# Patient Record
Sex: Male | Born: 1995 | Race: White | Hispanic: No | Marital: Single | Smoking: Current every day smoker
Health system: Southern US, Community
[De-identification: ages and names within clinical notes are randomized; demographics above are authoritative.]

## PROBLEM LIST (undated history)

## (undated) DIAGNOSIS — Z789 Other specified health status: Secondary | ICD-10-CM

## (undated) DIAGNOSIS — F1010 Alcohol abuse, uncomplicated: Secondary | ICD-10-CM

## (undated) HISTORY — PX: NO PAST SURGERIES: SHX2092

---
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
## 2017-01-28 ENCOUNTER — Encounter: Payer: Self-pay | Admitting: Emergency Medicine

## 2017-01-28 ENCOUNTER — Other Ambulatory Visit: Payer: Self-pay

## 2017-01-28 DIAGNOSIS — Y999 Unspecified external cause status: Secondary | ICD-10-CM | POA: Insufficient documentation

## 2017-01-28 DIAGNOSIS — S61511A Laceration without foreign body of right wrist, initial encounter: Secondary | ICD-10-CM | POA: Insufficient documentation

## 2017-01-28 DIAGNOSIS — W25XXXA Contact with sharp glass, initial encounter: Secondary | ICD-10-CM | POA: Insufficient documentation

## 2017-01-28 DIAGNOSIS — F17210 Nicotine dependence, cigarettes, uncomplicated: Secondary | ICD-10-CM | POA: Insufficient documentation

## 2017-01-28 DIAGNOSIS — Y9389 Activity, other specified: Secondary | ICD-10-CM | POA: Insufficient documentation

## 2017-01-28 DIAGNOSIS — Z23 Encounter for immunization: Secondary | ICD-10-CM | POA: Insufficient documentation

## 2017-01-28 DIAGNOSIS — Y929 Unspecified place or not applicable: Secondary | ICD-10-CM | POA: Insufficient documentation

## 2017-01-28 NOTE — ED Triage Notes (Signed)
Pt arrives POV and ambulatory to triage with c/o right wrist laceration. Pt reports that he was trying to catch some window glass and it broke. Pt has approximately 3-4 inch laceration to underside of right wrist with controlled bleeding at this time. Pt is in NAD.

## 2017-01-29 ENCOUNTER — Emergency Department: Payer: Self-pay

## 2017-01-29 ENCOUNTER — Emergency Department
Admission: EM | Admit: 2017-01-29 | Discharge: 2017-01-29 | Disposition: A | Discharge status: Home / Self Care | Payer: Self-pay | Attending: Emergency Medicine | Admitting: Emergency Medicine

## 2017-01-29 ENCOUNTER — Other Ambulatory Visit: Payer: Self-pay

## 2017-01-29 DIAGNOSIS — S61511A Laceration without foreign body of right wrist, initial encounter: Secondary | ICD-10-CM

## 2017-01-29 MED ORDER — TETANUS-DIPHTH-ACELL PERTUSSIS 5-2.5-18.5 LF-MCG/0.5 IM SUSP
INTRAMUSCULAR | Status: AC
  Administered 2017-01-29: 0.5 mL via INTRAMUSCULAR
  Filled 2017-01-29: qty 0.5

## 2017-01-29 MED ORDER — LIDOCAINE HCL (PF) 1 % IJ SOLN
5.0000 mL | Freq: Once | INTRAMUSCULAR | Status: AC
  Administered 2017-01-29: 5 mL
  Filled 2017-01-29: qty 5

## 2017-01-29 MED ORDER — TETANUS-DIPHTHERIA TOXOIDS TD 5-2 LFU IM INJ
0.5000 mL | INJECTION | Freq: Once | INTRAMUSCULAR | Status: DC
  Filled 2017-01-29: qty 0.5

## 2017-01-29 MED ORDER — TETANUS-DIPHTH-ACELL PERTUSSIS 5-2.5-18.5 LF-MCG/0.5 IM SUSP
0.5000 mL | Freq: Once | INTRAMUSCULAR | Status: AC
  Administered 2017-01-29: 0.5 mL via INTRAMUSCULAR

## 2017-01-29 NOTE — ED Provider Notes (Signed)
[facility] Emergency Department Provider Note  ____________________________________________  Time seen: Approximately 4:31 AM  I have reviewed the triage vital signs and the nursing notes.   HISTORY  Chief Complaint Laceration   HPI Jeffrey Shepherd is a 21 y.o. male with no significant past medical history who presents for evaluation of a laceration. Patient reports that he was working on his house changing some old glass from the window when the glass shattered and a piece fell onto his wrist. He sustained a laceration in the volar aspect of his right wrist.he is complaining of mild constant pain since that happened at 9 PM this evening. Patient does not remember when his last tetanus shot was but he thinks was more than 10 years ago. Patient denies any other injuries.   History reviewed. No pertinent past medical history.  There are no active problems to display for this patient.   History reviewed. No pertinent surgical history.  Prior to Admission medications   Not on File    Allergies Patient has no known allergies.  No family history on file.  Social History Social History   Tobacco Use  . Smoking status: Current Every Day Smoker  . Smokeless tobacco: Never Used  Substance Use Topics  . Alcohol use: Yes    Frequency: Never    Comment: occasionally  . Drug use: No    Review of Systems  Constitutional: Negative for fever. Eyes: Negative for visual changes. ENT: Negative for sore throat. Neck: No neck pain  Cardiovascular: Negative for chest pain. Respiratory: Negative for shortness of breath. Gastrointestinal: Negative for abdominal pain, vomiting or diarrhea. Genitourinary: Negative for dysuria. Musculoskeletal: Negative for back pain. Skin: Negative for rash. + R wrist laceration Neurological: Negative for headaches, weakness or numbness. Psych: No SI or HI  ____________________________________________   PHYSICAL  EXAM:  VITAL SIGNS: ED Triage Vitals  Enc Vitals Group     BP 01/28/17 2305 (!) 168/78     Pulse Rate 01/28/17 2305 (!) 103     Resp 01/28/17 2305 18     Temp 01/28/17 2305 98.7 F (37.1 C)     Temp Source 01/28/17 2305 Oral     SpO2 01/28/17 2305 98 %     Weight 01/28/17 2306 183 lb (83 kg)     Height 01/28/17 2306 5\' 6"  (1.676 m)     Head Circumference --      Peak Flow --      Pain Score 01/28/17 2306 0     Pain Loc --      Pain Edu? --      Excl. in GC? --     Constitutional: Alert and oriented. Well appearing and in no apparent distress. HEENT:      Head: Normocephalic and atraumatic.         Eyes: Conjunctivae are normal. Sclera is non-icteric.       Mouth/Throat: Mucous membranes are moist.       Neck: Supple with no signs of meningismus. Cardiovascular: Regular rate and rhythm. No murmurs, gallops, or rubs. 2+ symmetrical distal pulses are present in all extremities. No JVD. Respiratory: Normal respiratory effort. Lungs are clear to auscultation bilaterally. No wheezes, crackles, or rhonchi.  Musculoskeletal: 5 cm laceration over the volar aspect of the R wrist with no tendon or arterial involvement, neurovascular exam normal. Neurologic: Normal speech and language. Face is symmetric. Moving all extremities. No gross focal neurologic deficits are appreciated. Skin: Skin is warm, dry  and intact. No rash noted. Psychiatric: Mood and affect are normal. Speech and behavior are normal.  ____________________________________________   LABS (all labs ordered are listed, but only abnormal results are displayed)  Labs Reviewed - No data to display ____________________________________________  EKG  none  ____________________________________________  RADIOLOGY  XR R wrist:  Soft tissue laceration of the volar aspect of the wrist. No radiopaque soft tissue foreign bodies. No acute bony  abnormalities. ____________________________________________   PROCEDURES  Procedure(s) performed:yes .Marland Kitchen.Laceration Repair Date/Time: 01/29/2017 6:28 AM Performed by: Nita SickleVeronese, [place], MD Authorized by: Nita SickleVeronese, [place], MD   Consent:    Consent obtained:  Verbal   Consent given by:  Patient   Risks discussed:  Infection, need for additional repair, pain, poor cosmetic result, retained foreign body, tendon damage and nerve damage Anesthesia (see MAR for exact dosages):    Anesthesia method:  Local infiltration   Local anesthetic:  Lidocaine 1% w/o epi Laceration details:    Location:  Shoulder/arm   Shoulder/arm location:  R lower arm   Length (cm):  6 Repair type:    Repair type:  Simple Pre-procedure details:    Preparation:  Patient was prepped and draped in usual sterile fashion and imaging obtained to evaluate for foreign bodies Exploration:    Hemostasis achieved with:  Direct pressure   Wound exploration: entire depth of wound probed and visualized     Wound extent: no fascia violation noted, no foreign bodies/material noted, no muscle damage noted, no nerve damage noted, no tendon damage noted, no underlying fracture noted and no vascular damage noted     Contaminated: no   Treatment:    Area cleansed with:  Betadine   Amount of cleaning:  Standard   Irrigation solution:  Tap water   Irrigation method:  Tap   Visualized foreign bodies/material removed: no   Skin repair:    Repair method:  Sutures   Suture size:  5-0   Suture material:  Prolene   Suture technique:  Simple interrupted Approximation:    Approximation:  Close   Vermilion border: well-aligned   Post-procedure details:    Dressing:  Open (no dressing)   Patient tolerance of procedure:  Tolerated well, no immediate complications   Critical Care performed:  None ____________________________________________   INITIAL IMPRESSION / ASSESSMENT AND PLAN / ED COURSE   21 y.o. male with no  significant past medical history who presents for evaluation of a 6 cm laceration to the R volar wrist.no foreign bodies seen on visual inspection and x-ray. Limb neurovascularly intact with no tendon involvement. Laceration was repair per procedure note above. Tetanus shot was given. Wound care and signs of infection were discussed with patient and recommended that he return if these develop.      As part of my medical decision making, I reviewed the following data within the electronic MEDICAL RECORD NUMBER Nursing notes reviewed and incorporated, Radiograph reviewed , Notes from prior ED visits and [place] Controlled Substance Database    Pertinent labs & imaging results that were available during my care of the patient were reviewed by me and considered in my medical decision making (see chart for details).    ____________________________________________   FINAL CLINICAL IMPRESSION(S) / ED DIAGNOSES  Final diagnoses:  Laceration of right wrist, initial encounter      NEW MEDICATIONS STARTED DURING THIS VISIT:  ED Discharge Orders    None       Note:  This document was prepared using Dragon voice recognition  software and may include unintentional dictation errors.    Don Perking, [place], MD 01/29/17 0630

## 2017-01-29 NOTE — ED Notes (Signed)
Pt states that he was at home working and changing glass when it broke and cut his right wrist. Family at bedside.

## 2017-01-29 NOTE — Discharge Instructions (Signed)
Keep laceration dry and clean. Wash with warm water and soap. Apply topical bacitracin. Protect from the sun to minimize scarring. Cover it with SPF 70 or higher and use hat when out in the sun for 6-9 months. You received 5 stitches that must be removed in 5-7 days.  Watch for signs of infection: pus, redness of the skin surrounding it, or fever. If these develop see your doctor or return to the ER for antibiotics.

## 2017-05-31 ENCOUNTER — Emergency Department
Admission: EM | Admit: 2017-05-31 | Discharge: 2017-05-31 | Disposition: A | Discharge status: Home / Self Care | Payer: Self-pay | Attending: Emergency Medicine | Admitting: Emergency Medicine

## 2017-05-31 ENCOUNTER — Other Ambulatory Visit: Payer: Self-pay

## 2017-05-31 ENCOUNTER — Encounter: Payer: Self-pay | Admitting: Emergency Medicine

## 2017-05-31 DIAGNOSIS — W268XXA Contact with other sharp object(s), not elsewhere classified, initial encounter: Secondary | ICD-10-CM | POA: Insufficient documentation

## 2017-05-31 DIAGNOSIS — Z5321 Procedure and treatment not carried out due to patient leaving prior to being seen by health care provider: Secondary | ICD-10-CM | POA: Insufficient documentation

## 2017-05-31 DIAGNOSIS — Y929 Unspecified place or not applicable: Secondary | ICD-10-CM | POA: Insufficient documentation

## 2017-05-31 DIAGNOSIS — Y9389 Activity, other specified: Secondary | ICD-10-CM | POA: Insufficient documentation

## 2017-05-31 DIAGNOSIS — Y999 Unspecified external cause status: Secondary | ICD-10-CM | POA: Insufficient documentation

## 2017-05-31 DIAGNOSIS — S61411A Laceration without foreign body of right hand, initial encounter: Secondary | ICD-10-CM | POA: Insufficient documentation

## 2017-05-31 NOTE — ED Triage Notes (Addendum)
Patient ambulatory to triage with steady gait, without difficulty or distress noted; pt reports cutting fire wood, it splintered cutting hand; approx 1" lac noted between right thumb & 1st finger with no active bleeding; gauze dressing applied

## 2017-07-12 ENCOUNTER — Observation Stay
Admission: EM | Admit: 2017-07-12 | Discharge: 2017-07-13 | Disposition: A | Discharge status: Home / Self Care | Payer: Self-pay | Attending: Internal Medicine | Admitting: Internal Medicine

## 2017-07-12 ENCOUNTER — Other Ambulatory Visit: Payer: Self-pay

## 2017-07-12 ENCOUNTER — Observation Stay: Payer: Self-pay

## 2017-07-12 DIAGNOSIS — K922 Gastrointestinal hemorrhage, unspecified: Principal | ICD-10-CM

## 2017-07-12 DIAGNOSIS — Z79899 Other long term (current) drug therapy: Secondary | ICD-10-CM | POA: Insufficient documentation

## 2017-07-12 DIAGNOSIS — K760 Fatty (change of) liver, not elsewhere classified: Secondary | ICD-10-CM | POA: Insufficient documentation

## 2017-07-12 DIAGNOSIS — Z8249 Family history of ischemic heart disease and other diseases of the circulatory system: Secondary | ICD-10-CM | POA: Insufficient documentation

## 2017-07-12 DIAGNOSIS — R7989 Other specified abnormal findings of blood chemistry: Secondary | ICD-10-CM | POA: Insufficient documentation

## 2017-07-12 DIAGNOSIS — F17200 Nicotine dependence, unspecified, uncomplicated: Secondary | ICD-10-CM | POA: Insufficient documentation

## 2017-07-12 DIAGNOSIS — R197 Diarrhea, unspecified: Secondary | ICD-10-CM | POA: Insufficient documentation

## 2017-07-12 DIAGNOSIS — R1030 Lower abdominal pain, unspecified: Secondary | ICD-10-CM | POA: Insufficient documentation

## 2017-07-12 DIAGNOSIS — F1010 Alcohol abuse, uncomplicated: Secondary | ICD-10-CM | POA: Insufficient documentation

## 2017-07-12 DIAGNOSIS — R509 Fever, unspecified: Secondary | ICD-10-CM | POA: Insufficient documentation

## 2017-07-12 HISTORY — DX: Alcohol abuse, uncomplicated: F10.10

## 2017-07-12 HISTORY — DX: Other specified health status: Z78.9

## 2017-07-12 LAB — URINALYSIS, COMPLETE (UACMP) WITH MICROSCOPIC
BILIRUBIN URINE: NEGATIVE
Bacteria, UA: NONE SEEN
Glucose, UA: NEGATIVE mg/dL
HGB URINE DIPSTICK: NEGATIVE
Ketones, ur: NEGATIVE mg/dL
LEUKOCYTES UA: NEGATIVE
NITRITE: NEGATIVE
PH: 6 (ref 5.0–8.0)
Protein, ur: NEGATIVE mg/dL
SPECIFIC GRAVITY, URINE: 1.023 (ref 1.005–1.030)
Squamous Epithelial / LPF: NONE SEEN (ref 0–5)

## 2017-07-12 LAB — HEPATIC FUNCTION PANEL
ALT: 169 U/L — ABNORMAL HIGH (ref 17–63)
AST: 121 U/L — AB (ref 15–41)
Albumin: 4.4 g/dL (ref 3.5–5.0)
Alkaline Phosphatase: 62 U/L (ref 38–126)
BILIRUBIN DIRECT: 0.1 mg/dL (ref 0.1–0.5)
Indirect Bilirubin: 0.8 mg/dL (ref 0.3–0.9)
TOTAL PROTEIN: 7.8 g/dL (ref 6.5–8.1)
Total Bilirubin: 0.9 mg/dL (ref 0.3–1.2)

## 2017-07-12 LAB — BASIC METABOLIC PANEL
ANION GAP: 10 (ref 5–15)
BUN: 14 mg/dL (ref 6–20)
CO2: 25 mmol/L (ref 22–32)
Calcium: 9.2 mg/dL (ref 8.9–10.3)
Chloride: 100 mmol/L — ABNORMAL LOW (ref 101–111)
Creatinine, Ser: 0.81 mg/dL (ref 0.61–1.24)
GFR calc Af Amer: >60 mL/min (ref >60)
GFR calc non Af Amer: >60 mL/min (ref >60)
Glucose, Bld: 118 mg/dL — ABNORMAL HIGH (ref 65–99)
POTASSIUM: 3.7 mmol/L (ref 3.5–5.1)
SODIUM: 135 mmol/L (ref 135–145)

## 2017-07-12 LAB — CBC WITH DIFFERENTIAL/PLATELET
BASOS PCT: 1 %
Basophils Absolute: 0 10*3/uL (ref 0–0.1)
EOS ABS: 0.1 10*3/uL (ref 0–0.7)
Eosinophils Relative: 1 %
HCT: 48.9 % (ref 40.0–52.0)
HEMOGLOBIN: 16.9 g/dL (ref 13.0–18.0)
LYMPHS ABS: 1.5 10*3/uL (ref 1.0–3.6)
Lymphocytes Relative: 20 %
MCH: 32.9 pg (ref 26.0–34.0)
MCHC: 34.5 g/dL (ref 32.0–36.0)
MCV: 95.2 fL (ref 80.0–100.0)
Monocytes Absolute: 0.5 10*3/uL (ref 0.2–1.0)
Monocytes Relative: 7 %
NEUTROS PCT: 71 %
Neutro Abs: 5.1 10*3/uL (ref 1.4–6.5)
Platelets: 223 10*3/uL (ref 150–440)
RBC: 5.14 MIL/uL (ref 4.40–5.90)
RDW: 13.2 % (ref 11.5–14.5)
WBC: 7.2 10*3/uL (ref 3.8–10.6)

## 2017-07-12 LAB — PROTIME-INR
INR: 1.04
PROTHROMBIN TIME: 13.5 s (ref 11.4–15.2)

## 2017-07-12 LAB — LIPASE, BLOOD: Lipase: 26 U/L (ref 11–51)

## 2017-07-12 LAB — HEMOGLOBIN: Hemoglobin: 15.8 g/dL (ref 13.0–18.0)

## 2017-07-12 MED ORDER — SODIUM CHLORIDE 0.9 % IV SOLN
8.0000 mg/h | INTRAVENOUS | Status: DC
  Administered 2017-07-12 – 2017-07-13 (×2): 8 mg/h via INTRAVENOUS
  Filled 2017-07-12 (×4): qty 80

## 2017-07-12 MED ORDER — SODIUM CHLORIDE 0.9 % IV BOLUS
1000.0000 mL | Freq: Once | INTRAVENOUS | Status: AC
  Administered 2017-07-12: 1000 mL via INTRAVENOUS

## 2017-07-12 MED ORDER — ADULT MULTIVITAMIN W/MINERALS CH
ORAL_TABLET | ORAL | Status: AC
  Administered 2017-07-12: 20:00:00
  Filled 2017-07-12: qty 1

## 2017-07-12 MED ORDER — ONDANSETRON HCL 4 MG/2ML IJ SOLN
4.0000 mg | Freq: Once | INTRAMUSCULAR | Status: AC
Start: 2017-07-12 — End: 2017-07-12
  Administered 2017-07-12: 4 mg via INTRAVENOUS

## 2017-07-12 MED ORDER — FOLIC ACID 1 MG PO TABS
ORAL_TABLET | ORAL | Status: AC
  Administered 2017-07-12: 1 mg via ORAL
  Filled 2017-07-12: qty 1

## 2017-07-12 MED ORDER — IOPAMIDOL (ISOVUE-370) INJECTION 76%
75.0000 mL | Freq: Once | INTRAVENOUS | Status: AC | PRN
  Administered 2017-07-12: 75 mL via INTRAVENOUS

## 2017-07-12 MED ORDER — VITAMIN B-1 100 MG PO TABS
100.0000 mg | ORAL_TABLET | Freq: Every day | ORAL | Status: DC
  Administered 2017-07-12 – 2017-07-13 (×2): 100 mg via ORAL
  Filled 2017-07-12: qty 1

## 2017-07-12 MED ORDER — FOLIC ACID 1 MG PO TABS
1.0000 mg | ORAL_TABLET | Freq: Every day | ORAL | Status: DC
  Administered 2017-07-12 – 2017-07-13 (×2): 1 mg via ORAL
  Filled 2017-07-12: qty 1

## 2017-07-12 MED ORDER — ADULT MULTIVITAMIN W/MINERALS CH
1.0000 | ORAL_TABLET | Freq: Every day | ORAL | Status: DC
  Administered 2017-07-12 – 2017-07-13 (×2): 1 via ORAL
  Filled 2017-07-12: qty 1

## 2017-07-12 MED ORDER — ADULT MULTIVITAMIN W/MINERALS CH
ORAL_TABLET | ORAL | Status: AC
  Administered 2017-07-12: 1 via ORAL
  Filled 2017-07-12: qty 1

## 2017-07-12 MED ORDER — SODIUM CHLORIDE 0.9 % IV SOLN
80.0000 mg | Freq: Once | INTRAVENOUS | Status: AC
  Administered 2017-07-12: 80 mg via INTRAVENOUS
  Filled 2017-07-12: qty 80

## 2017-07-12 MED ORDER — LORAZEPAM 1 MG PO TABS: 1.0000 mg | ORAL_TABLET | ORAL | Status: DC | PRN

## 2017-07-12 MED ORDER — IOPAMIDOL (ISOVUE-300) INJECTION 61%
30.0000 mL | Freq: Once | INTRAVENOUS | Status: AC
  Administered 2017-07-12: 30 mL via ORAL

## 2017-07-12 MED ORDER — THIAMINE HCL 100 MG/ML IJ SOLN: 100.0000 mg | Freq: Every day | INTRAMUSCULAR | Status: DC

## 2017-07-12 MED ORDER — ONDANSETRON HCL 4 MG/2ML IJ SOLN
INTRAMUSCULAR | Status: AC
  Administered 2017-07-12: 21:00:00
  Filled 2017-07-12: qty 2

## 2017-07-12 MED ORDER — DOCUSATE SODIUM 100 MG PO CAPS: 100.0000 mg | ORAL_CAPSULE | Freq: Two times a day (BID) | ORAL | Status: DC | PRN

## 2017-07-12 MED ORDER — LORAZEPAM 2 MG/ML IJ SOLN: 1.0000 mg | INTRAMUSCULAR | Status: DC | PRN

## 2017-07-12 MED ORDER — SODIUM CHLORIDE 0.9 % IV SOLN
INTRAVENOUS | Status: DC
  Administered 2017-07-12 – 2017-07-13 (×2): via INTRAVENOUS

## 2017-07-12 MED ORDER — ONDANSETRON HCL 4 MG/2ML IJ SOLN
4.0000 mg | Freq: Once | INTRAMUSCULAR | Status: AC
  Administered 2017-07-12: 4 mg via INTRAVENOUS
  Filled 2017-07-12: qty 2

## 2017-07-12 NOTE — ED Notes (Signed)
Report received from [facility]. Patient care assumed. Patient/RN introduction complete. Admitting md in to eval, pt has no complaints of pain or nausea at this time. Meds infusing well, peripheral lines intact without redness or swelling. Family at bedside, will continue to monitor.

## 2017-07-12 NOTE — ED Notes (Signed)
Pt to CT then to 226

## 2017-07-12 NOTE — ED Notes (Signed)
Pt drinking contrast for CT. 

## 2017-07-12 NOTE — ED Notes (Signed)
ED Provider at bedside. 

## 2017-07-12 NOTE — ED Notes (Signed)
Report called to floor RN will transport to 226 after CT done.

## 2017-07-12 NOTE — ED Provider Notes (Signed)
[facility] Emergency Department Provider Note ____________________________________________   First MD Initiated Contact with Patient 07/12/17 1546     (approximate)  I have reviewed the triage vital signs and the nursing notes.   HISTORY  Chief Complaint Abdominal Pain; Emesis; and Diarrhea    HPI Jeffrey Shepherd is a 22 y.o. male with past history of alcohol abuse who presents with diarrhea and vomiting over the last 2 days, intermittent, and both developing some bright red blood today.  Patient denies any blood in the stool or vomit previously.  He reports one episode of bright red blood in vomit today.  He also reports increased abdominal distention over the last week, but with no acute abdominal pain.  No fever or chills.  No chest pain or difficulty breathing.  He denies any other abnormal bleeding or bruising.   Past Medical History:  Diagnosis Date  . Alcohol abuse     There are no active problems to display for this patient.   History reviewed. No pertinent surgical history.  Prior to Admission medications   Not on File    Allergies Patient has no known allergies.  No family history on file.  Social History Social History   Tobacco Use  . Smoking status: Current Every Day Smoker  . Smokeless tobacco: Never Used  Substance Use Topics  . Alcohol use: Yes    Frequency: Never    Comment: daily  . Drug use: No    Review of Systems  Constitutional: No fever. Eyes: No jaundice. ENT: No sore throat. Cardiovascular: Denies chest pain. Respiratory: Denies shortness of breath. Gastrointestinal: Positive for nausea, vomiting, diarrhea.  Genitourinary: Negative for dysuria.  Musculoskeletal: Negative for back pain. Skin: Negative for rash. Neurological: Negative for headache.   ____________________________________________   PHYSICAL EXAM:  VITAL SIGNS: ED Triage Vitals [07/12/17 1542]  Enc Vitals Group     BP (!) 160/101       Pulse Rate (!) 109     Resp 18     Temp 98 F (36.7 C)     Temp Source Oral     SpO2 98 %     Weight 195 lb (88.5 kg)     Height  (1.651 m)     Head Circumference      Peak Flow      Pain Score 9     Pain Loc      Pain Edu?      Excl. in GC?     Constitutional: Alert and oriented.  Relatively well appearing and in no acute distress. Eyes: Conjunctivae are normal.  No scleral icterus. Head: Atraumatic. Nose: No congestion/rhinnorhea. Mouth/Throat: Mucous membranes are dry though.   Neck: Normal range of motion.  Cardiovascular: Tachycardic, regular rhythm. Grossly normal heart sounds.  Good peripheral circulation. Respiratory: Normal respiratory effort.  No retractions. Lungs CTAB. Gastrointestinal: Soft and nontender.  Moderate distention distention.  No hemorrhoids or acute bleeding on DRE. Genitourinary: No flank tenderness. Musculoskeletal: No lower extremity edema.  Extremities warm and well perfused.  Neurologic:  Normal speech and language. No gross focal neurologic deficits are appreciated.  No tremors or asterixis. Skin:  Skin is warm and dry. No rash noted. Psychiatric: Mood and affect are normal. Speech and behavior are normal.  ____________________________________________   LABS (all labs ordered are listed, but only abnormal results are displayed)  Labs Reviewed  BASIC METABOLIC PANEL - Abnormal; Notable for the following components:  Result Value   Chloride 100 (*)    Glucose, Bld 118 (*)    All other components within normal limits  HEPATIC FUNCTION PANEL - Abnormal; Notable for the following components:   AST 121 (*)    ALT 169 (*)    All other components within normal limits  URINALYSIS, COMPLETE (UACMP) WITH MICROSCOPIC - Abnormal; Notable for the following components:   Color, Urine YELLOW (*)    APPearance CLEAR (*)    All other components within normal limits  LIPASE, BLOOD  CBC WITH DIFFERENTIAL/PLATELET  PROTIME-INR    ____________________________________________  EKG   ____________________________________________  RADIOLOGY    ____________________________________________   PROCEDURES  Procedure(s) performed: No  Procedures  Critical Care performed: No ____________________________________________   INITIAL IMPRESSION / ASSESSMENT AND PLAN / ED COURSE  Pertinent labs & imaging results that were available during my care of the patient were reviewed by me and considered in my medical decision making (see chart for details).  22 year old male with history of alcohol abuse presents with nausea, vomiting, diarrhea over the last 2 days, but with apparent blood both in the vomit in the stool noted today.  He also reports abdominal distention but no pain.  On exam, patient is slightly tachycardic, other vital signs are normal, and he is relatively well-appearing.  His abdomen is somewhat distended but nontender.  I performed DRE and there were no hemorrhoids and no active bleeding.  Only a trace amount of stool was obtained and this sample was guaiac negative.  Differential includes gastroenteritis or other benign cause, however given his history of alcohol abuse and the report of hematemesis, I am concerned for gastritis, PUD, or other acute GI bleed.  I have low suspicion for esophageal varices given his age and lack of prior history of cirrhosis, however his abdominal distention is concerning.  Plan: Symptomatic treatment, fluids, IV Protonix drip, lab work-up including LFTs and coags, and reassess.  Anticipate likely admission for GI work-up.    ----------------------------------------- 6:51 PM on 07/12/2017 -----------------------------------------  The patient's lab work-up is reassuring.  His vital signs have improved after fluids.  He has had no recurrent vomiting or diarrhea.  Given the possibility of upper GI bleed, the patient will still require admission for serial hemoglobins and  monitoring and possible GI consult.  The patient agrees with this plan.  I signed out the patient to the hospitalist Dr. Elisabeth Pigeon.  ____________________________________________   FINAL CLINICAL IMPRESSION(S) / ED DIAGNOSES  Final diagnoses:  Gastrointestinal hemorrhage, unspecified gastrointestinal hemorrhage type      NEW MEDICATIONS STARTED DURING THIS VISIT:  New Prescriptions   No medications on file     Note:  This document was prepared using Dragon voice recognition software and may include unintentional dictation errors.   Dionne Bucy, MD 07/12/17 [id number]

## 2017-07-12 NOTE — ED Notes (Signed)
Awaiting medications from pharmacy.

## 2017-07-12 NOTE — ED Notes (Signed)
Pt still drinking contrast for CT, co nausea med given.

## 2017-07-12 NOTE — ED Triage Notes (Signed)
To ER via [facility] from home c/o abdominal pain, NVD with bright red blood in emesis and diarrhea that began today. Pt drinks a fifth of vodka daily, last drink 0200AM today. Pt abdomen distended. VSS with EMS. Pt alert and oriented X4, active, cooperative, pt in NAD. RR even and unlabored, color WNL.

## 2017-07-12 NOTE — ED Notes (Signed)
Report to Raquel, RN

## 2017-07-12 NOTE — H&P (Signed)
[facility] - [place] [facility]   PATIENT NAME: Jeffrey Shepherd    MR#:  [id number]  DATE OF BIRTH:  23-Mar-1995  DATE OF ADMISSION:  07/12/2017  PRIMARY CARE PHYSICIAN: Patient, No Pcp Per   REQUESTING/REFERRING PHYSICIAN: Siadecki  CHIEF COMPLAINT:   Chief Complaint  Patient presents with  . Abdominal Pain  . Emesis  . Diarrhea    HISTORY OF PRESENT ILLNESS: Jeffrey Shepherd  is a 22 y.o. male with a known history of alcohol abuse and no other medical issues, had lower abdominal pain for last 2-3 days with some low-grade fever and loose diarrhea 5-6 times a day. He was feeling nauseated but did not vomit. Today he started having some blood in his stool which was fresh red color and had 2-3 episodes so far. He also had vomited once and there was some blood noted in there. He denies any similar episode of bleeding in the past He denies using over-the-counter pain medications. He feels his abdomen is slightly distended than his baseline. His hemoglobin is stillin normal range, but because of significant complaint and history of alcohol abuse, ER physician suggested to monitor in hospital for tonight.  PAST MEDICAL HISTORY:   Past Medical History:  Diagnosis Date  . Alcohol abuse   . Medical history non-contributory     PAST SURGICAL HISTORY:  Past Surgical History:  Procedure Laterality Date  . NO PAST SURGERIES      SOCIAL HISTORY:  Social History   Tobacco Use  . Smoking status: Current Every Day Smoker  . Smokeless tobacco: Never Used  Substance Use Topics  . Alcohol use: Yes    Frequency: Never    Comment: daily    FAMILY HISTORY:  Family History  Problem Relation Age of Onset  . CAD Father     DRUG ALLERGIES: No Known Allergies  REVIEW OF SYSTEMS:   CONSTITUTIONAL: No fever, fatigue or weakness.  EYES: No blurred or double vision.  EARS, NOSE, AND THROAT: No tinnitus or ear pain.  RESPIRATORY: No cough, shortness of breath, wheezing or  hemoptysis.  CARDIOVASCULAR: No chest pain, orthopnea, edema.  GASTROINTESTINAL: have nausea, vomiting, diarrhea or abdominal pain.  GENITOURINARY: No dysuria, hematuria.  ENDOCRINE: No polyuria, nocturia,  HEMATOLOGY: No anemia, easy bruising or bleeding SKIN: No rash or lesion. MUSCULOSKELETAL: No joint pain or arthritis.   NEUROLOGIC: No tingling, numbness, weakness.  PSYCHIATRY: No anxiety or depression.   MEDICATIONS AT HOME:  Prior to Admission medications   Not on File      PHYSICAL EXAMINATION:   VITAL SIGNS: Blood pressure (!) 153/94, pulse 76, temperature 98 F (36.7 C), temperature source Oral, resp. rate 20, height  (1.651 m), weight 88.5 kg (195 lb), SpO2 98 %.  GENERAL:  22 y.o.-year-old patient lying in the bed with no acute distress.  EYES: Pupils equal, round, reactive to light and accommodation. No scleral icterus. Extraocular muscles intact.  HEENT: Head atraumatic, normocephalic. Oropharynx and nasopharynx clear.  NECK:  Supple, no jugular venous distention. No thyroid enlargement, no tenderness.  LUNGS: Normal breath sounds bilaterally, no wheezing, rales,rhonchi or crepitation. No use of accessory muscles of respiration.  CARDIOVASCULAR: S1, S2 normal. No murmurs, rubs, or gallops.  ABDOMEN: Soft, ;lower abd tender, distended. Bowel sounds present. No organomegaly or mass.  EXTREMITIES: No pedal edema, cyanosis, or clubbing.  NEUROLOGIC: Cranial nerves II through XII are intact. Muscle strength 5/5 in all extremities. Sensation intact. Gait not checked.  PSYCHIATRIC: The patient  is alert and oriented x 3.  SKIN: No obvious rash, lesion, or ulcer.   LABORATORY PANEL:   CBC Recent Labs  Lab 07/12/17 1620  WBC 7.2  HGB 16.9  HCT 48.9  PLT 223  MCV 95.2  MCH 32.9  MCHC 34.5  RDW 13.2  LYMPHSABS 1.5  MONOABS 0.5  EOSABS 0.1  BASOSABS 0.0    ------------------------------------------------------------------------------------------------------------------  Chemistries  Recent Labs  Lab 07/12/17 1620  NA 135  K 3.7  CL 100*  CO2 25  GLUCOSE 118*  BUN 14  CREATININE 0.81  CALCIUM 9.2  AST 121*  ALT 169*  ALKPHOS 62  BILITOT 0.9   ------------------------------------------------------------------------------------------------------------------ estimated creatinine clearance is 145 mL/min (by C-G formula based on SCr of 0.81 mg/dL). ------------------------------------------------------------------------------------------------------------------ No results for input(s): TSH, T4TOTAL, T3FREE, THYROIDAB in the last 72 hours.  Invalid input(s): FREET3   Coagulation profile Recent Labs  Lab 07/12/17 1620  INR 1.04   ------------------------------------------------------------------------------------------------------------------- No results for input(s): DDIMER in the last 72 hours. -------------------------------------------------------------------------------------------------------------------  Cardiac Enzymes No results for input(s): CKMB, TROPONINI, MYOGLOBIN in the last 168 hours.  Invalid input(s): CK ------------------------------------------------------------------------------------------------------------------ Invalid input(s): POCBNP  ---------------------------------------------------------------------------------------------------------------  Urinalysis    Component Value Date/Time   COLORURINE YELLOW (A) 07/12/2017 1756   APPEARANCEUR CLEAR (A) 07/12/2017 1756   LABSPEC 1.023 07/12/2017 1756   PHURINE 6.0 07/12/2017 1756   GLUCOSEU NEGATIVE 07/12/2017 1756   HGBUR NEGATIVE 07/12/2017 1756   BILIRUBINUR NEGATIVE 07/12/2017 1756   KETONESUR NEGATIVE 07/12/2017 1756   PROTEINUR NEGATIVE 07/12/2017 1756   NITRITE NEGATIVE 07/12/2017 1756   LEUKOCYTESUR NEGATIVE 07/12/2017 1756      RADIOLOGY: No results found.  EKG: No orders found for this or any previous visit.  IMPRESSION AND PLAN:  * gI bleed   Will monitor serial hemoglobin.   Keep on clear liquid diet orally.   Protonix IV drip started by ER, continue.   gI consult and keep nothing by mouth from midnight for possible procedures tomorrow.  * lower abdominal pain and low-grade fever with diarrhea   There may be colitis   I will check CT scan abdomen and pelvis with contrast.   gI consult is already requested.  * elevated liver enzymes   This could be due to his alcohol abuse, continue monitoring.  * alcohol abuse   He may be at higher risk of going into withdrawal, currently no signs, we will keep on CIWA protocol.  * Active smoking   Counseled to quit smoking for 4 minutes and offered nicotine patch.  All the records are reviewed and case discussed with ED provider. Management plans discussed with the patient, family and they are in agreement.  CODE STATUS:Full code.    TOTAL TIME TAKING CARE OF THIS PATIENT: 50 minutes.    Altamese Dilling M.D on 07/12/2017   Between 7am to 6pm - Pager - [phone]  After 6pm go to [email or web address] - password [email or web address]  [facility]  Office  [phone]  CC: Primary care physician; Patient, No Pcp Per   Note: This dictation was prepared with Dragon dictation along with smaller phrase technology. Any transcriptional errors that result from this process are unintentional.

## 2017-07-13 LAB — HEPATIC FUNCTION PANEL
ALT: 149 U/L — AB (ref 17–63)
AST: 131 U/L — ABNORMAL HIGH (ref 15–41)
Albumin: 3.8 g/dL (ref 3.5–5.0)
Alkaline Phosphatase: 52 U/L (ref 38–126)
BILIRUBIN DIRECT: 0.2 mg/dL (ref 0.1–0.5)
BILIRUBIN INDIRECT: 1.2 mg/dL — AB (ref 0.3–0.9)
Total Bilirubin: 1.4 mg/dL — ABNORMAL HIGH (ref 0.3–1.2)
Total Protein: 6.7 g/dL (ref 6.5–8.1)

## 2017-07-13 LAB — HEMOGLOBIN: HEMOGLOBIN: 14.8 g/dL (ref 13.0–18.0)

## 2017-07-13 LAB — CBC
HCT: 43.3 % (ref 40.0–52.0)
HEMOGLOBIN: 15 g/dL (ref 13.0–18.0)
MCH: 33.4 pg (ref 26.0–34.0)
MCHC: 34.7 g/dL (ref 32.0–36.0)
MCV: 96.1 fL (ref 80.0–100.0)
Platelets: 165 10*3/uL (ref 150–440)
RBC: 4.51 MIL/uL (ref 4.40–5.90)
RDW: 12.8 % (ref 11.5–14.5)
WBC: 6.8 10*3/uL (ref 3.8–10.6)

## 2017-07-13 LAB — BASIC METABOLIC PANEL
Anion gap: 6 (ref 5–15)
BUN: 9 mg/dL (ref 6–20)
CALCIUM: 8.5 mg/dL — AB (ref 8.9–10.3)
CO2: 26 mmol/L (ref 22–32)
Chloride: 103 mmol/L (ref 101–111)
Creatinine, Ser: 0.86 mg/dL (ref 0.61–1.24)
GFR calc Af Amer: >60 mL/min (ref >60)
GLUCOSE: 104 mg/dL — AB (ref 65–99)
Potassium: 3.6 mmol/L (ref 3.5–5.1)
SODIUM: 135 mmol/L (ref 135–145)

## 2017-07-13 MED ORDER — DOCUSATE SODIUM 100 MG PO CAPS
200.0000 mg | ORAL_CAPSULE | Freq: Two times a day (BID) | ORAL | Status: DC
  Administered 2017-07-13: 200 mg via ORAL
  Filled 2017-07-13: qty 2

## 2017-07-13 MED ORDER — POLYETHYLENE GLYCOL 3350 17 G PO PACK
17.0000 g | PACK | Freq: Every day | ORAL | Status: DC
  Administered 2017-07-13: 17 g via ORAL
  Filled 2017-07-13: qty 1

## 2017-07-13 MED ORDER — SIMETHICONE 80 MG PO CHEW: 80.0000 mg | CHEWABLE_TABLET | Freq: Four times a day (QID) | ORAL | 0 refills | Status: AC

## 2017-07-13 MED ORDER — ONDANSETRON 4 MG PO TBDP
4.0000 mg | ORAL_TABLET | Freq: Once | ORAL | Status: AC
  Administered 2017-07-13: 4 mg via ORAL
  Filled 2017-07-13: qty 1

## 2017-07-13 MED ORDER — DOCUSATE SODIUM 100 MG PO CAPS: 100.0000 mg | ORAL_CAPSULE | Freq: Two times a day (BID) | ORAL | 0 refills | Status: AC

## 2017-07-13 MED ORDER — ONDANSETRON HCL 4 MG PO TABS: 4.0000 mg | ORAL_TABLET | Freq: Three times a day (TID) | ORAL | 0 refills | Status: AC | PRN

## 2017-07-13 MED ORDER — SIMETHICONE 40 MG/0.6ML PO SUSP
80.0000 mg | Freq: Once | ORAL | Status: AC
  Administered 2017-07-13: 80 mg via ORAL
  Filled 2017-07-13 (×2): qty 1.2

## 2017-07-13 NOTE — Discharge Summary (Signed)
[facility] - [place] at [facility], [place] y.o., DOB 1995/09/06, MRN [id number]. Admission date: 07/12/2017 Discharge Date 07/13/2017 Primary MD Patient, No Pcp Per Admitting Physician Altamese Dilling, MD  Admission Diagnosis  Gastrointestinal hemorrhage, unspecified gastrointestinal hemorrhage type [K92.2]  Discharge Diagnosis   Principal Problem:    gI bleed likey due to internal hemorrhoid     Abdominal pain CT scan shows no pathology Elevated liver function due to alcohol abuse   strongly recommended he stops drinking Nicotine abuse      Hospital Course Jeffrey Shepherd is a 22 y.o. male with past history of alcohol abuse who presents with diarrhea and vomiting over the last 2 days, intermittent, and both developing some bright red blood today.  Patient was evaluated in the ED and admitted because her liver tests were elevated.  Patient had a CT scan which showed fatty liver.  Patient has a history of heavy alcohol use he was recommended to stop drinking.  Has not had any further bleeding hemoglobin is remained stable.  He can follow-up outpatient with GI.              Consults  None  Significant Tests:  See full reports for all details     Ct Abdomen Pelvis W Contrast  Result Date: 07/12/2017 CLINICAL DATA:  Diarrhea and vomiting 2 days with some bright red blood today. History of alcohol abuse. EXAM: CT ABDOMEN AND PELVIS WITH CONTRAST TECHNIQUE: Multidetector CT imaging of the abdomen and pelvis was performed using the standard protocol following bolus administration of intravenous contrast. CONTRAST:  75mL ISOVUE-370 IOPAMIDOL (ISOVUE-370) INJECTION 76% COMPARISON:  None. FINDINGS: Lower chest: No acute abnormality. Hepatobiliary: Moderate diffuse low-attenuation of the liver without focal mass. Gallbladder and biliary tree are normal. Pancreas: Normal. Spleen: Normal. Adrenals/Urinary Tract: Adrenal glands are normal. Kidneys are normal in  size without hydronephrosis or nephrolithiasis. Ureters and bladder are normal. Stomach/Bowel: Stomach and small bowel are normal. The appendix is normal. Colon is normal. Vascular/Lymphatic: Normal. Reproductive: Normal. Other: No free fluid or focal inflammatory change. Musculoskeletal: Normal. IMPRESSION: No acute findings in the abdomen/pelvis. Diffuse low-attenuation of the liver likely hepatic steatosis. No focal mass. Electronically Signed   By: Elberta Fortis M.D.   On: 07/12/2017 21:45       Today   Subjective:   Jeffrey Shepherd patient feeling better no abdominal pain nausea or vomiting  Objective:   Blood pressure (!) 165/109, pulse 65, temperature 97.9 F (36.6 C), temperature source Oral, resp. rate 16, height  (1.651 m), weight 88.5 kg (195 lb), SpO2 99 %.  . No intake or output data in the 24 hours ending 07/13/17 1558  Exam VITAL SIGNS: Blood pressure (!) 165/109, pulse 65, temperature 97.9 F (36.6 C), temperature source Oral, resp. rate 16, height  (1.651 m), weight 88.5 kg (195 lb), SpO2 99 %.  GENERAL:  22 y.o.-year-old patient lying in the bed with no acute distress.  EYES: Pupils equal, round, reactive to light and accommodation. No scleral icterus. Extraocular muscles intact.  HEENT: Head atraumatic, normocephalic. Oropharynx and nasopharynx clear.  NECK:  Supple, no jugular venous distention. No thyroid enlargement, no tenderness.  LUNGS: Normal breath sounds bilaterally, no wheezing, rales,rhonchi or crepitation. No use of accessory muscles of respiration.  CARDIOVASCULAR: S1, S2 normal. No murmurs, rubs, or gallops.  ABDOMEN: Soft, nontender, nondistended. Bowel sounds present. No organomegaly or mass.  EXTREMITIES: No pedal edema, cyanosis, or clubbing.  NEUROLOGIC: Cranial  nerves II through XII are intact. Muscle strength 5/5 in all extremities. Sensation intact. Gait not checked.  PSYCHIATRIC: The patient is alert and oriented x 3.  SKIN: No obvious  rash, lesion, or ulcer.   Data Review     CBC w Diff:  Lab Results  Component Value Date   WBC 6.8 07/13/2017   HGB 14.8 07/13/2017   HCT 43.3 07/13/2017   PLT 165 07/13/2017   LYMPHOPCT 20 07/12/2017   MONOPCT 7 07/12/2017   EOSPCT 1 07/12/2017   BASOPCT 1 07/12/2017   CMP:  Lab Results  Component Value Date   NA 135 07/13/2017   K 3.6 07/13/2017   CL 103 07/13/2017   CO2 26 07/13/2017   BUN 9 07/13/2017   CREATININE 0.86 07/13/2017   PROT 6.7 07/13/2017   ALBUMIN 3.8 07/13/2017   BILITOT 1.4 (H) 07/13/2017   ALKPHOS 52 07/13/2017   AST 131 (H) 07/13/2017   ALT 149 (H) 07/13/2017  .  Micro Results No results found for this or any previous visit (from the past 240 hour(s)).      Code Status Orders  (From admission, onward)        Start     Ordered   07/12/17 2209  Full code  Continuous     07/12/17 2208    Code Status History    This patient has a current code status but no historical code status.          Follow-up Information    Wyline Mood, MD Follow up in 2 week(s).   Specialty:  Gastroenterology Why:  gi bleeding Contact information: [address] [place] [address] [phone]           Discharge Medications   Allergies as of 07/13/2017   No Known Allergies     Medication List    TAKE these medications   docusate sodium 100 MG capsule Commonly known as:  COLACE Take 1 capsule (100 mg total) by mouth 2 (two) times daily for 5 days.   ondansetron 4 MG tablet Commonly known as:  ZOFRAN Take 1 tablet (4 mg total) by mouth every 8 (eight) hours as needed for nausea or vomiting.   simethicone 80 MG chewable tablet Commonly known as:  GAS-X Chew 1 tablet (80 mg total) by mouth 4 (four) times daily for 3 days.          Total Time in preparing paper work, data evaluation and todays exam - 35 minutes  Auburn Bilberry M.D on 07/13/2017 at 3:58 PM [facility]   Office  [phone]

## 2017-07-14 LAB — HIV ANTIBODY (ROUTINE TESTING W REFLEX): HIV SCREEN 4TH GENERATION: NONREACTIVE

## 2019-07-13 IMAGING — CT CT ABD-PELV W/ CM
2 of 4 series · 17 of 46 positions shown, 19 images · IV contrast (APPLIED)
Comparison: None.

CLINICAL DATA: Diarrhea and vomiting 2 days with some bright red
blood today. History of alcohol abuse.

EXAM:
CT ABDOMEN AND PELVIS WITH CONTRAST
TECHNIQUE: Multidetector CT imaging of the abdomen and pelvis was performed
using the standard protocol following bolus administration of
intravenous contrast.
CONTRAST:  75mL HPPU6O-2OC IOPAMIDOL (HPPU6O-2OC) INJECTION 76%

[Series 2: routine abd/pel with · axial · 0.86mm/px · z∈[-913,-453]mm · 14 of 102 slices shown, 16 images]
[im 5/102  soft-tissue]
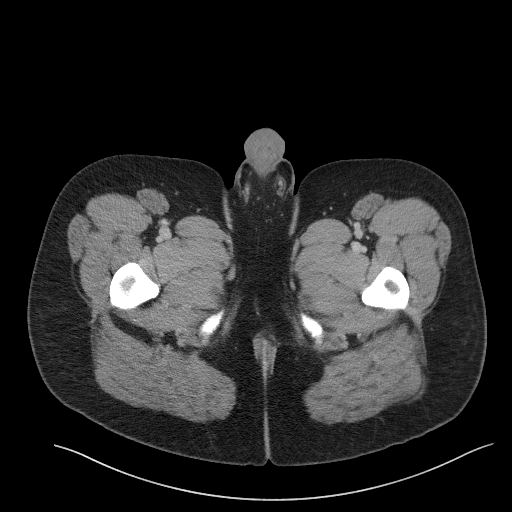
[im 5/102  bone]
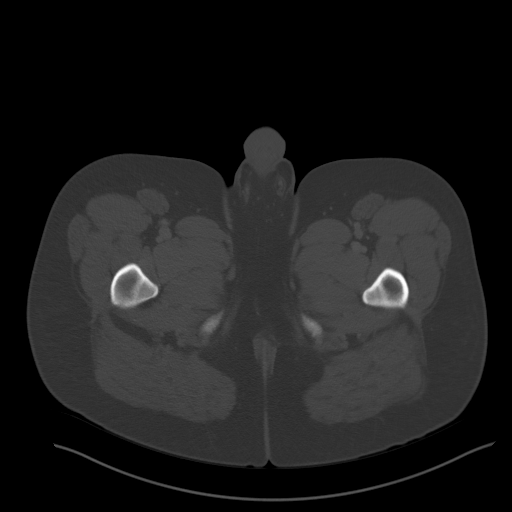
[im 13/102  soft-tissue]
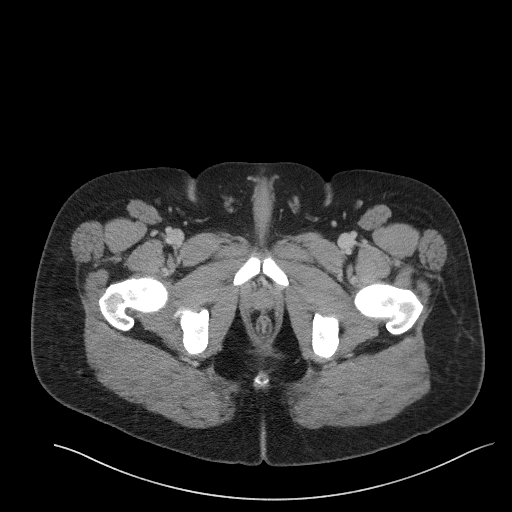
[im 22/102  soft-tissue]
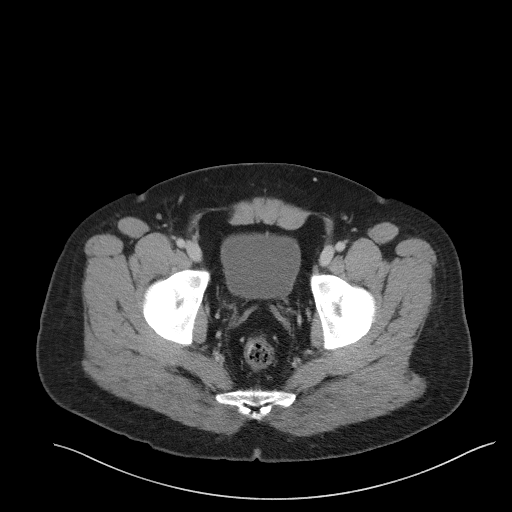
[im 26/102  soft-tissue]
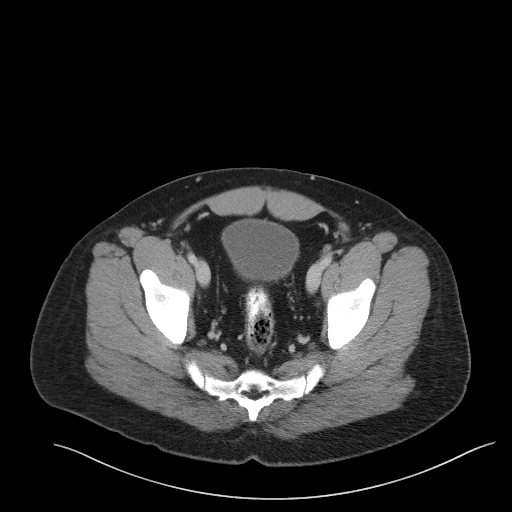
[im 34/102  soft-tissue]
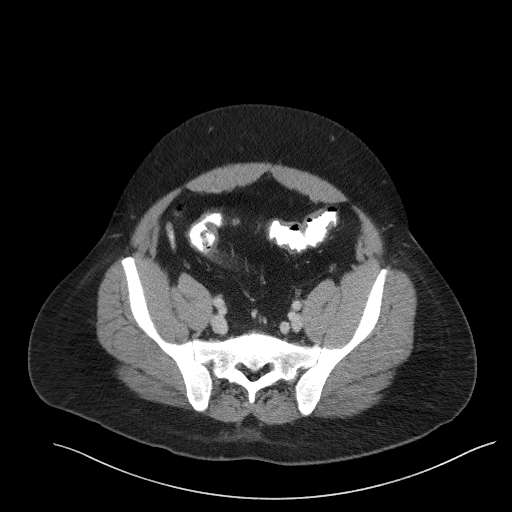
[im 43/102  soft-tissue]
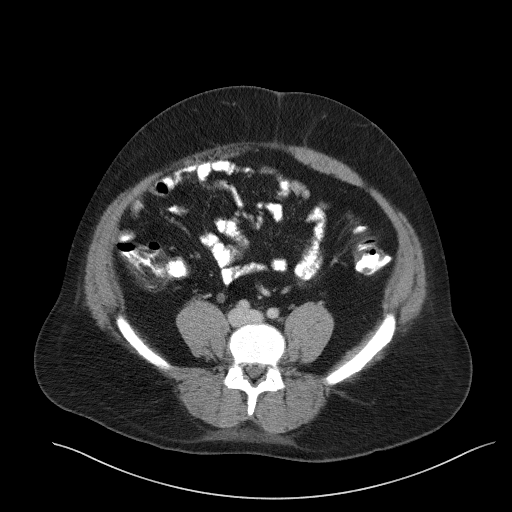
[im 47/102  soft-tissue]
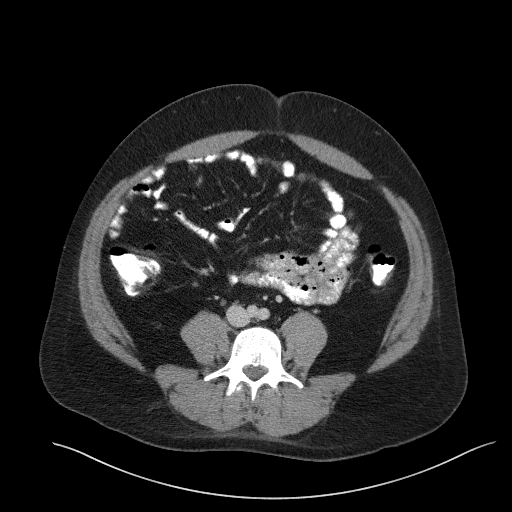
[im 55/102  soft-tissue]
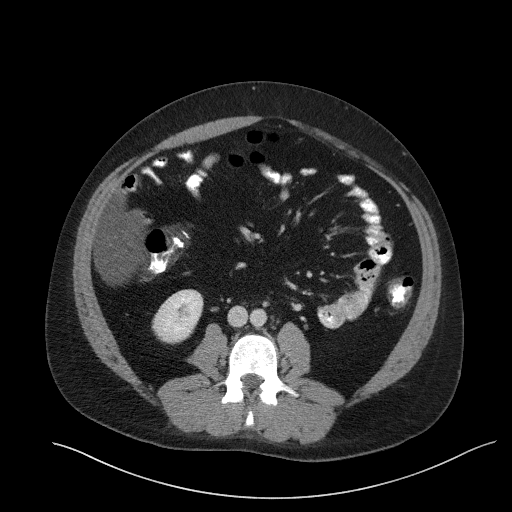
[im 59/102  soft-tissue]
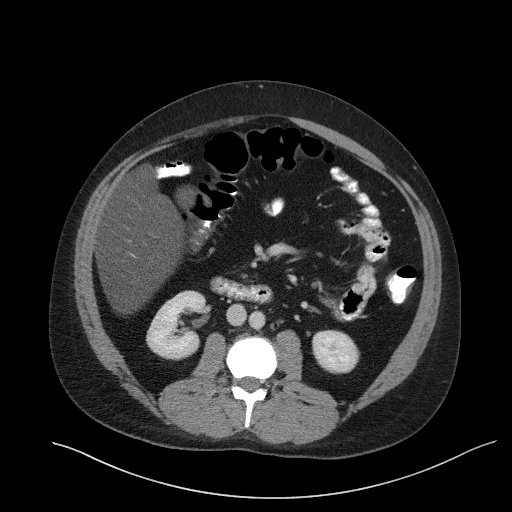
[im 59/102  bone]
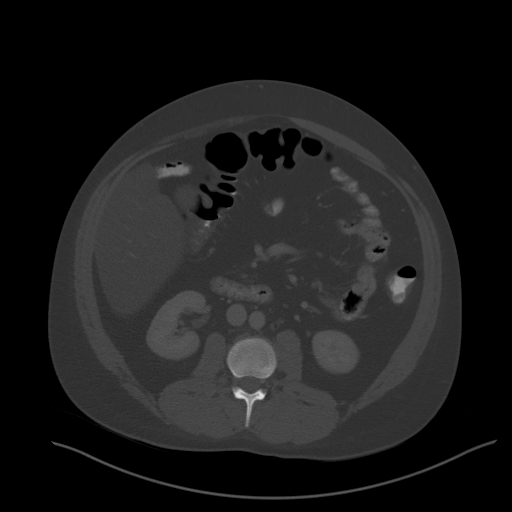
[im 68/102  soft-tissue]
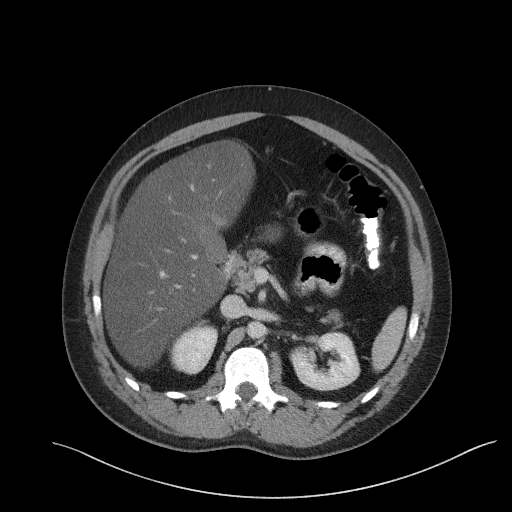
[im 76/102  soft-tissue]
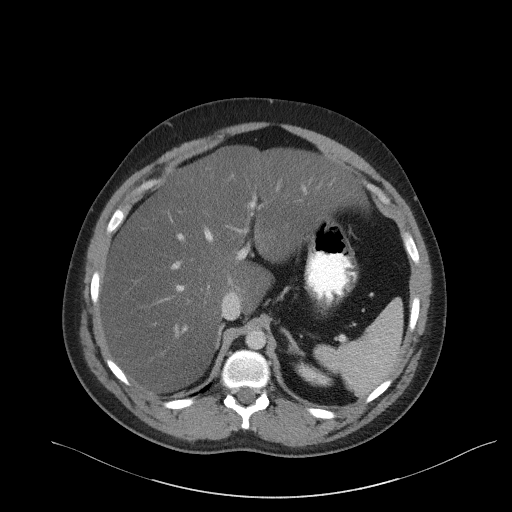
[im 80/102  soft-tissue]
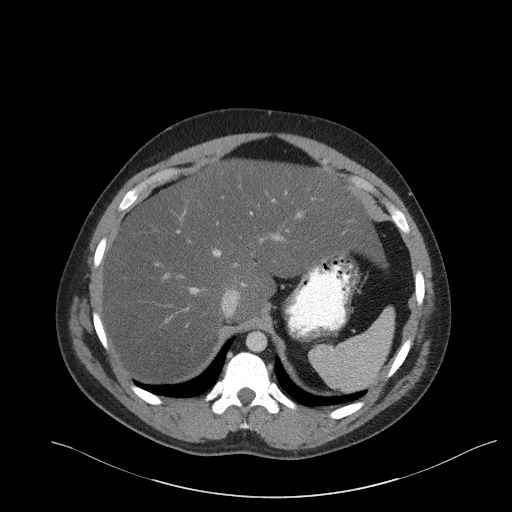
[im 89/102  soft-tissue]
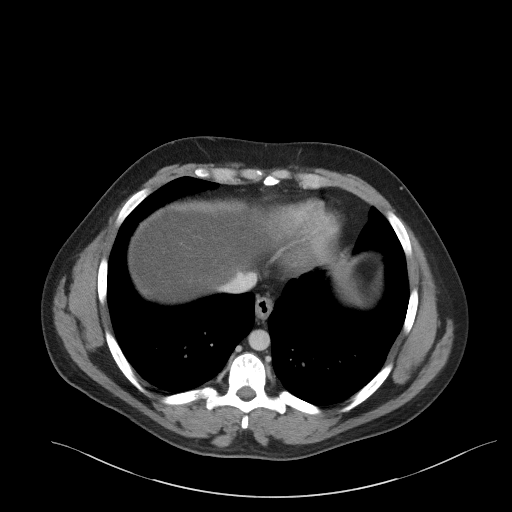
[im 97/102  soft-tissue]
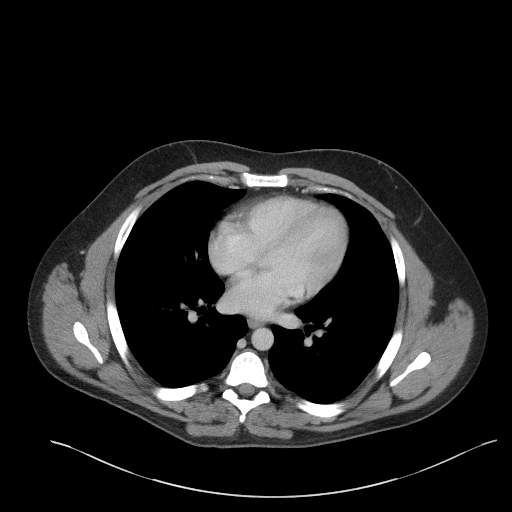

[Series 5: coronal st · coronal · 0.81mm/px · 3 of 105 slices shown]
[im 35/105  soft-tissue]
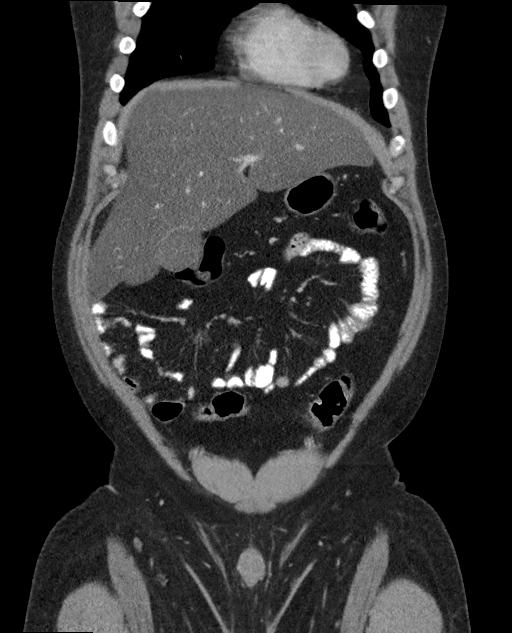
[im 47/105  soft-tissue]
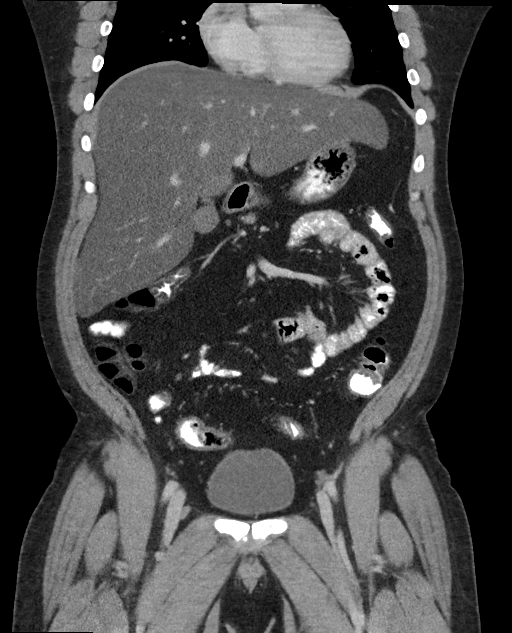
[im 58/105  soft-tissue]
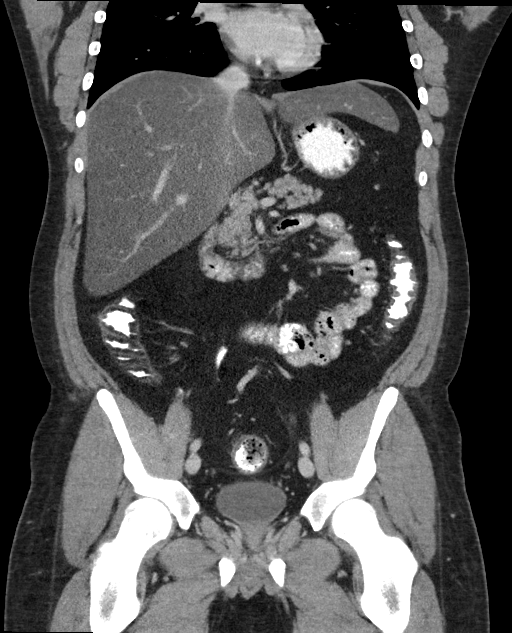

[17 of 46 positions shown; findings below may reference images not displayed]

FINDINGS: Lower chest: No acute abnormality.

Hepatobiliary: Moderate diffuse low-attenuation of the liver without
focal mass. Gallbladder and biliary tree are normal.

Pancreas: Normal.

Spleen: Normal.

Adrenals/Urinary Tract: Adrenal glands are normal. Kidneys are
normal in size without hydronephrosis or nephrolithiasis. Ureters
and bladder are normal.

Stomach/Bowel: Stomach and small bowel are normal. The appendix is
normal. Colon is normal.

Vascular/Lymphatic: Normal.

Reproductive: Normal.

Other: No free fluid or focal inflammatory change.

Musculoskeletal: Normal.
IMPRESSION: No acute findings in the abdomen/pelvis.

Diffuse low-attenuation of the liver likely hepatic steatosis. No
focal mass.

## 2020-04-13 ENCOUNTER — Other Ambulatory Visit: Payer: Self-pay | Admitting: Psychiatry

## 2020-07-10 ENCOUNTER — Telehealth: Payer: Self-pay | Admitting: Pharmacist

## 2020-07-10 NOTE — Telephone Encounter (Signed)
Patient failed to provide requested 2022 financial documentation. Unable to determine patient's eligibility status for [facility]. No additional medication assistance will be provided by [facility] without the required proof of income documentation. Patient notified by letter.  V[facility] Administrative Assistant
